# Patient Record
Sex: Male | Born: 2000 | Hispanic: Yes | Marital: Single | Smoking: Never smoker
Health system: Southern US, Community
[De-identification: ages and names within clinical notes are randomized; demographics above are authoritative.]

## PROBLEM LIST (undated history)

## (undated) DIAGNOSIS — R011 Cardiac murmur, unspecified: Secondary | ICD-10-CM

---
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
## 2022-04-03 DIAGNOSIS — Q230 Congenital stenosis of aortic valve: Secondary | ICD-10-CM | POA: Diagnosis not present

## 2022-04-03 DIAGNOSIS — I77810 Thoracic aortic ectasia: Secondary | ICD-10-CM | POA: Diagnosis not present

## 2022-04-03 DIAGNOSIS — Q248 Other specified congenital malformations of heart: Secondary | ICD-10-CM | POA: Diagnosis not present

## 2022-04-03 DIAGNOSIS — Q231 Congenital insufficiency of aortic valve: Secondary | ICD-10-CM | POA: Diagnosis not present

## 2023-11-27 IMAGING — CT CT ABD-PEL WO/W CM
2 of 5 series · 15 of 46 positions shown, 17 images · IV contrast (agent unspecified)
Comparison: None Available.

CLINICAL DATA: Lower abdominal pain for 2 weeks. Vomiting.
Leukocytosis.

EXAM:
CT ABDOMEN AND PELVIS WITHOUT AND WITH CONTRAST
TECHNIQUE: Multidetector CT imaging of the abdomen and pelvis was performed
following the standard protocol before and following the bolus
administration of intravenous contrast.

[Series 2: routine abd/pel wo · axial · 0.72mm/px · z∈[-1027,-622]mm · 12 of 97 slices shown, 14 images]
[im 8/97  soft-tissue]
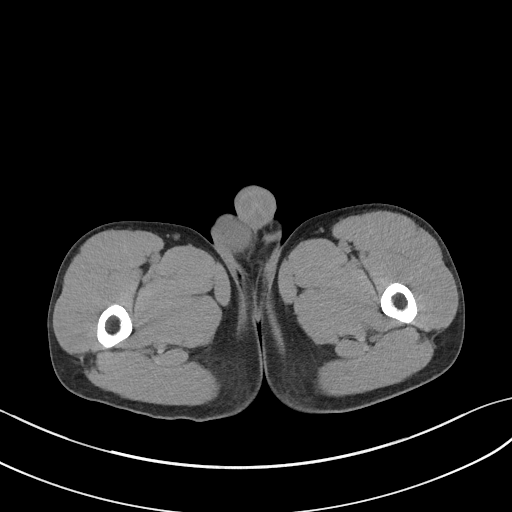
[im 8/97  bone]
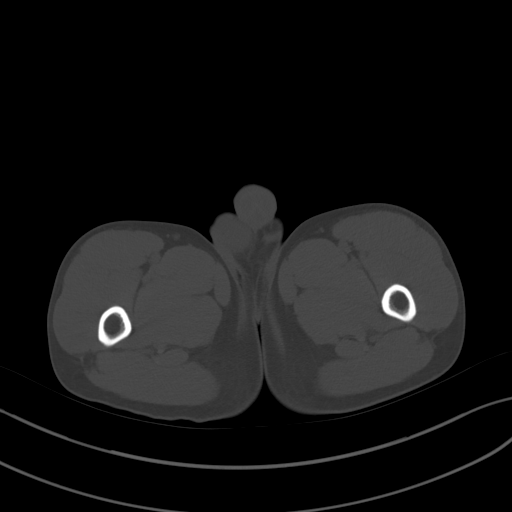
[im 15/97  soft-tissue]
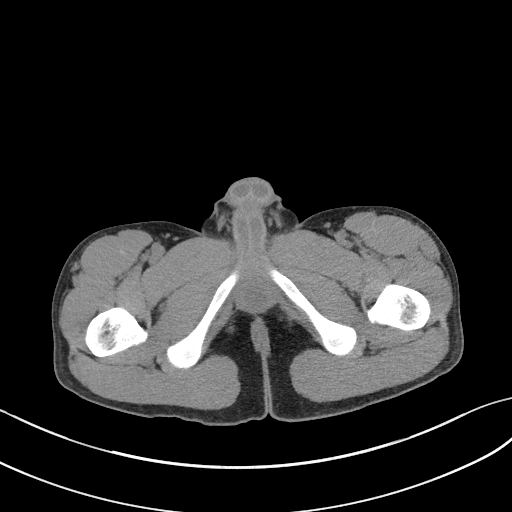
[im 23/97  soft-tissue]
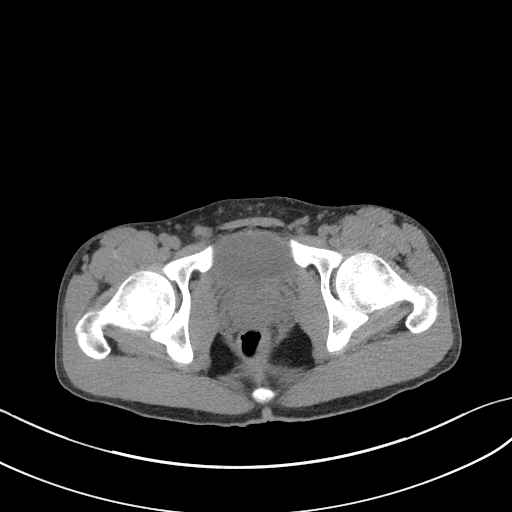
[im 30/97  soft-tissue]
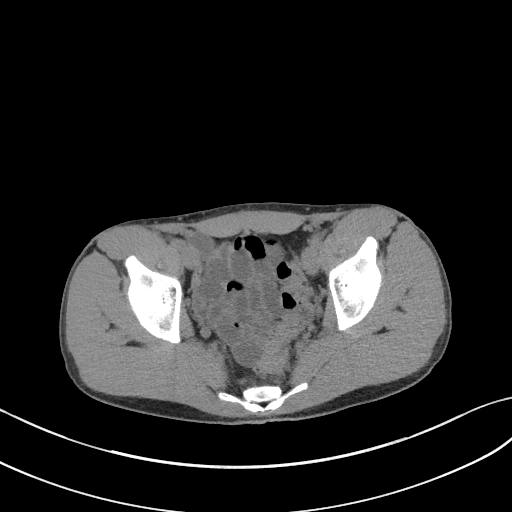
[im 37/97  soft-tissue]
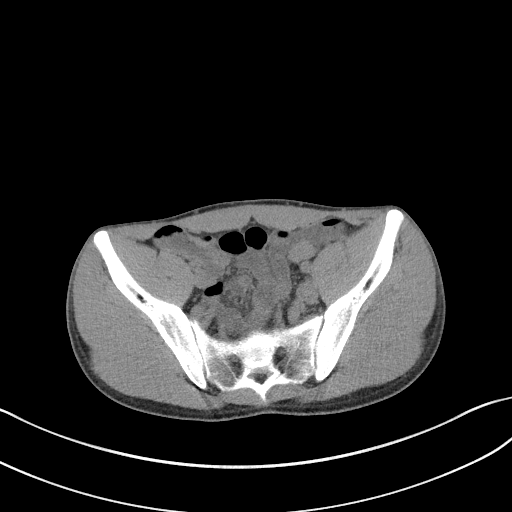
[im 45/97  soft-tissue]
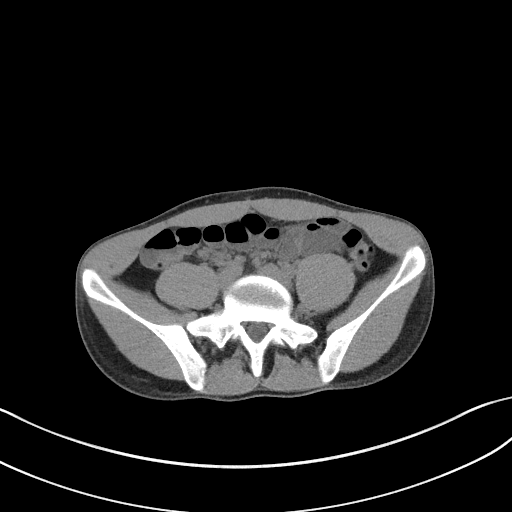
[im 52/97  soft-tissue]
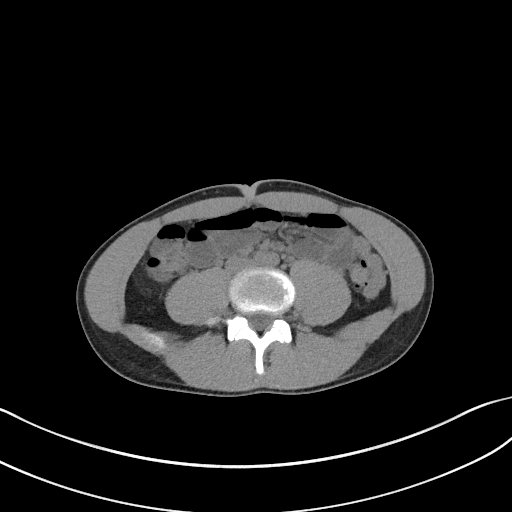
[im 60/97  soft-tissue]
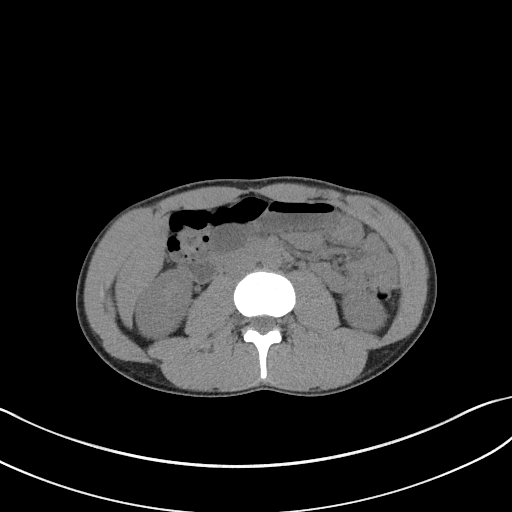
[im 67/97  soft-tissue]
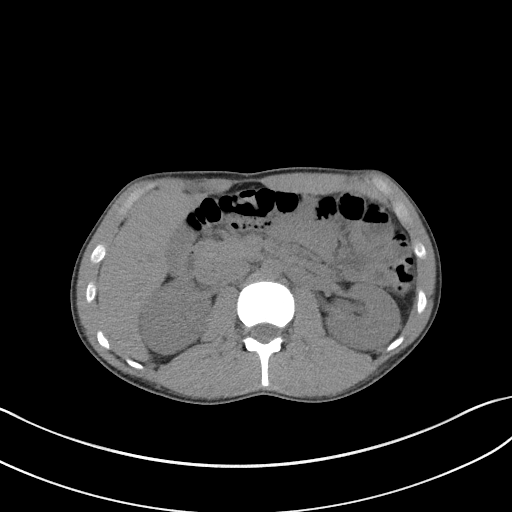
[im 67/97  bone]
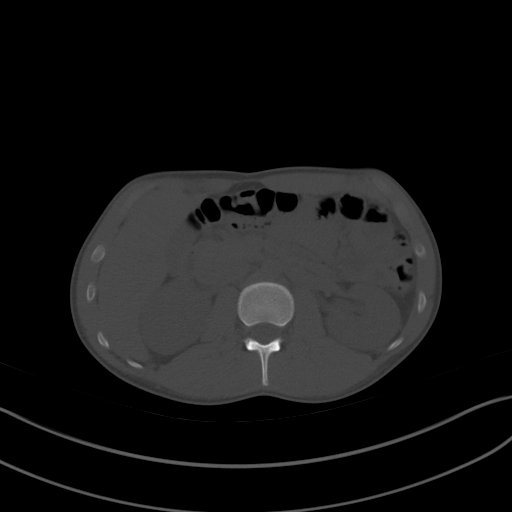
[im 74/97  soft-tissue]
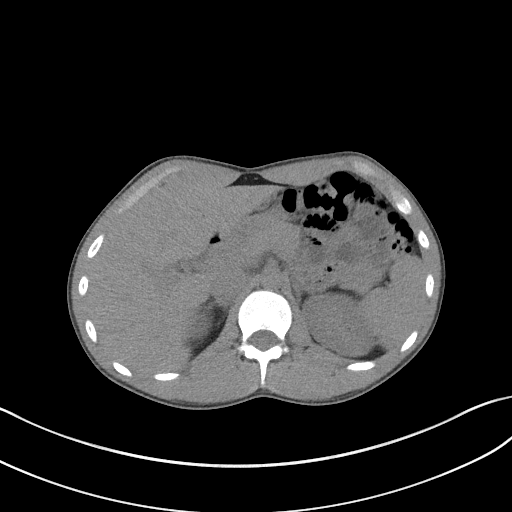
[im 82/97  soft-tissue]
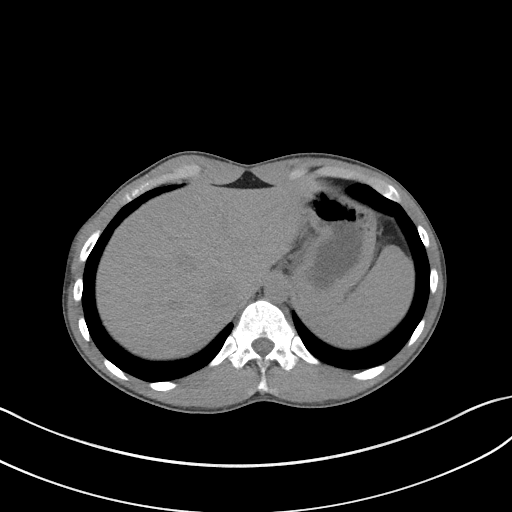
[im 89/97  soft-tissue]
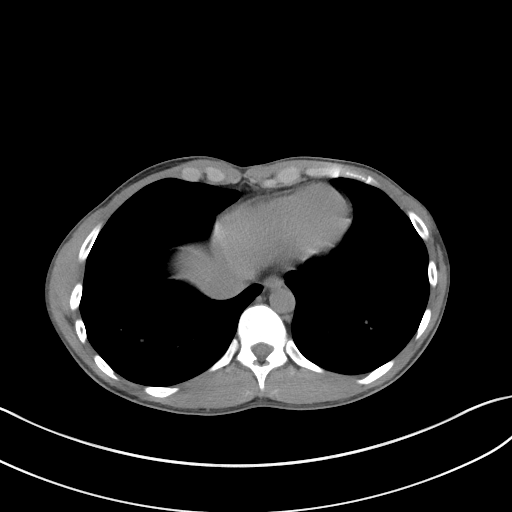

[Series 6: coronal st · coronal · 0.61mm/px · 3 of 59 slices shown]
[im 20/59  soft-tissue]
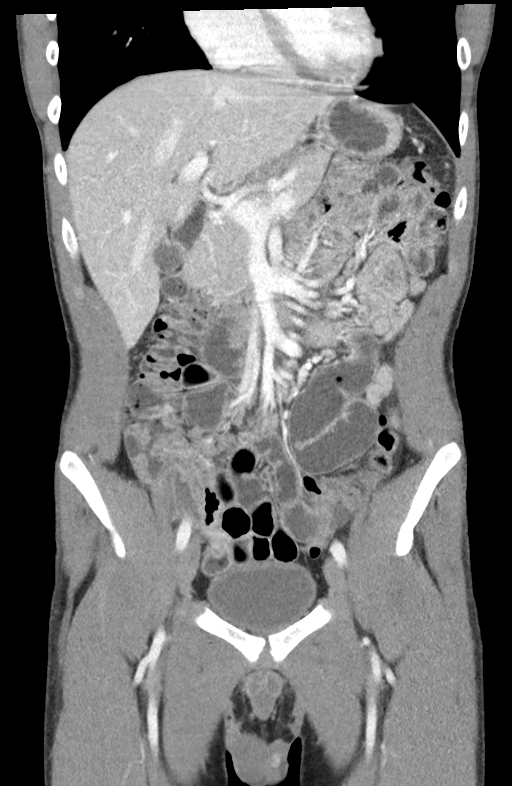
[im 26/59  soft-tissue]
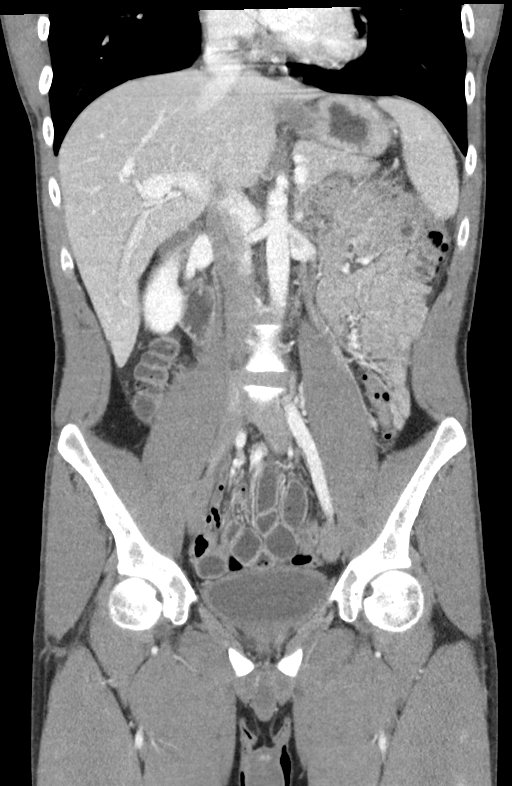
[im 33/59  soft-tissue]
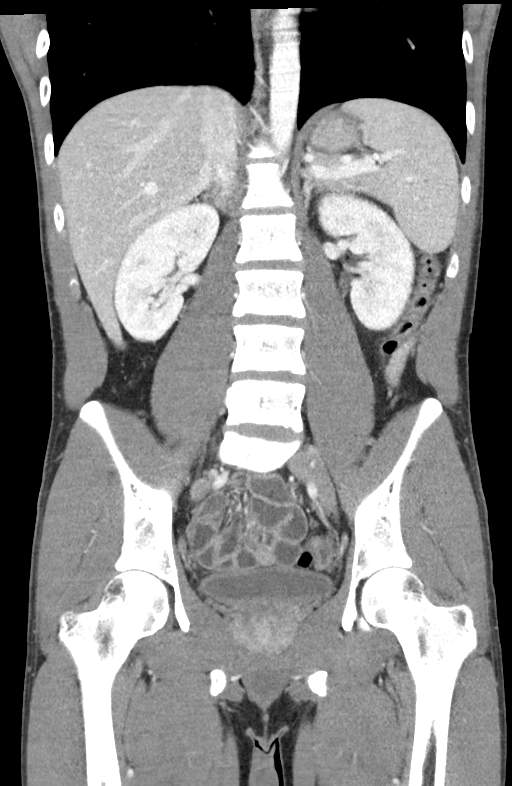

[15 of 46 positions shown; findings below may reference images not displayed]

RADIATION DOSE REDUCTION: This exam was performed according to the
departmental dose-optimization program which includes automated
exposure control, adjustment of the mA and/or kV according to
patient size and/or use of iterative reconstruction technique.

CONTRAST:  100mL OMNIPAQUE IOHEXOL 300 MG/ML  SOLN
FINDINGS: Lower Chest: No acute findings.

Hepatobiliary: No hepatic masses identified. Tiny less than 1 cm
left hepatic lobe cyst incidentally noted. Gallbladder is
unremarkable. No evidence of biliary ductal dilatation.

Pancreas:  No mass or inflammatory changes.

Spleen: Within normal limits in size and appearance.

Adrenals/Urinary Tract: No masses identified. No evidence of
urolithiasis or hydronephrosis. Unremarkable unopacified urinary
bladder.

Stomach/Bowel: No evidence of obstruction, inflammatory process or
abnormal fluid collections. Normal appendix visualized.

Vascular/Lymphatic: No pathologically enlarged lymph nodes. No acute
vascular findings.

Reproductive:  No mass or other significant abnormality.

Other:  None.

Musculoskeletal:  No suspicious bone lesions identified.
IMPRESSION: No acute findings or other significant abnormality.
